# Patient Record
Sex: Male | Born: 2000 | Race: White | Hispanic: No | Marital: Single | Smoking: Never smoker
Health system: Southern US, Community
[De-identification: ages and names within clinical notes are randomized; demographics above are authoritative.]

---
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
## 2018-05-21 IMAGING — DX DG SCOLIOSIS EVAL COMPLETE SPINE 1V
1 series · 1 of 1 positions shown · non-contrast
Comparison: None in PACs

CLINICAL DATA: Suspected scoliosis on physical examination. The
patient reports some back pain.

EXAM:
DG SCOLIOSIS EVAL COMPLETE SPINE 1V

[dg scoliosis ap]
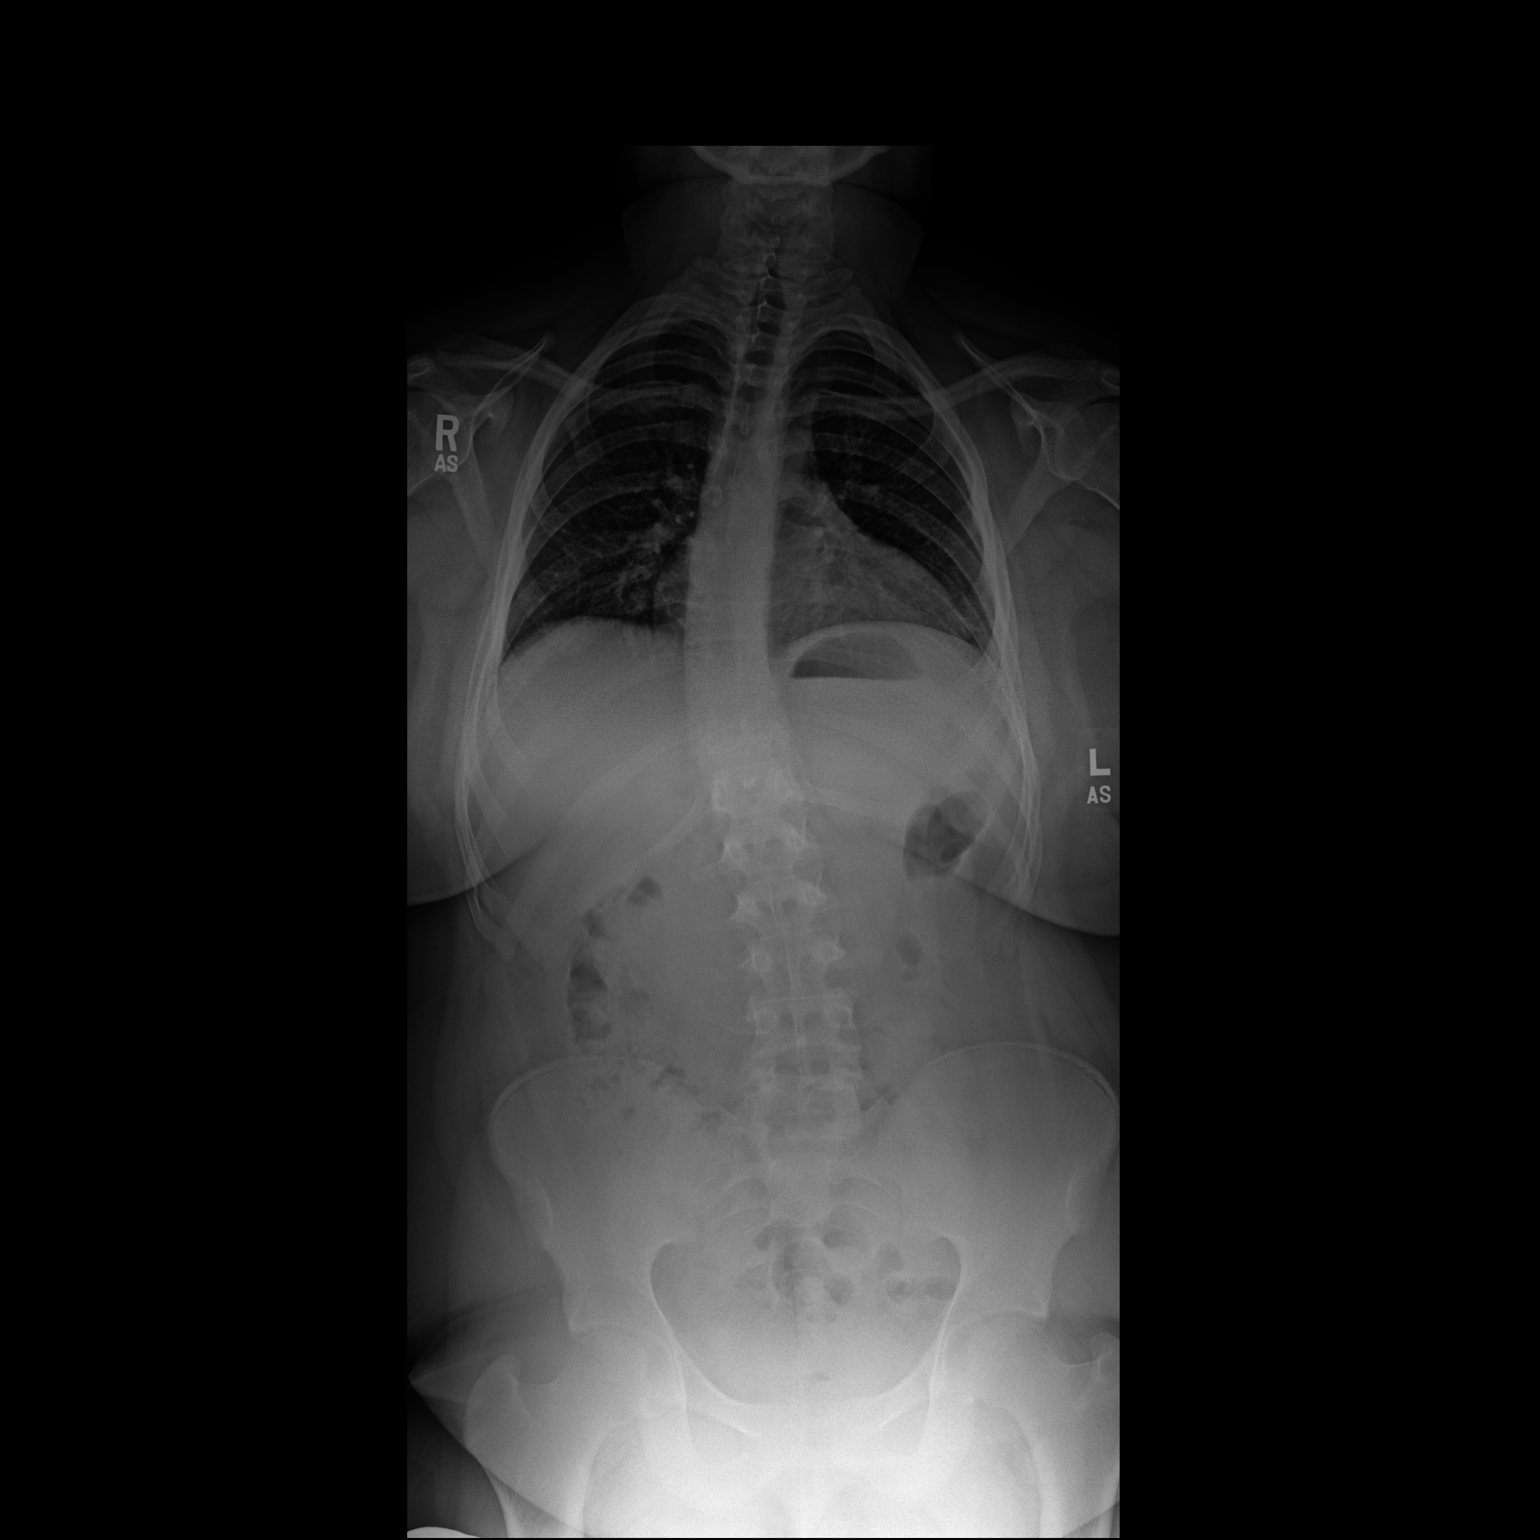

[1 of 1 positions shown; findings below may reference images not displayed]

FINDINGS: There is S shaped thoracolumbar curvature. The upper curvature
convex toward the right is centered at T10 with angulation of 22
degrees. The lower curvature is centered at L5 convex toward the
left with angulation of 13 degrees. No vertebral body anomalies are
observed. The observed portions of the chest and abdomen are
unremarkable.
IMPRESSION: S shaped thoracolumbar curvature with angulation as described.

## 2020-03-23 ENCOUNTER — Ambulatory Visit: Payer: 59 | Attending: Internal Medicine

## 2020-03-23 DIAGNOSIS — Z23 Encounter for immunization: Secondary | ICD-10-CM

## 2020-03-23 NOTE — Progress Notes (Signed)
   Covid-19 Vaccination Clinic  Name:  Nathaniel Dickerson    MRN: [id number] DOB: 01/28/2001  03/23/2020  Nathaniel Dickerson was observed post Covid-19 immunization for 15 minutes without incident. He was provided with Vaccine Information Sheet and instruction to access the V-Safe system.   Nathaniel Dickerson was instructed to call 911 with any severe reactions post vaccine: Marland Kitchen Difficulty breathing  . Swelling of face and throat  . A fast heartbeat  . A bad rash all over body  . Dizziness and weakness   Immunizations Administered    Name Date Dose VIS Date Route   Pfizer COVID-19 Vaccine 03/23/2020 10:00 AM 0.3 mL 11/25/2019 Intramuscular   Manufacturer: [employer], [employer]   Lot: [id number]   NDC: [address]-4

## 2020-04-16 ENCOUNTER — Ambulatory Visit: Payer: 59 | Attending: Internal Medicine

## 2020-04-16 DIAGNOSIS — Z23 Encounter for immunization: Secondary | ICD-10-CM

## 2020-04-16 NOTE — Progress Notes (Signed)
   Covid-19 Vaccination Clinic  Name:  Nathaniel Dickerson    MRN: [id number] DOB: 01-Feb-2001  04/16/2020  Mr. [name] was observed post Covid-19 immunization for 15 minutes without incident. He was provided with Vaccine Information Sheet and instruction to access the V-Safe system.   Mr. [name] was instructed to call 911 with any severe reactions post vaccine: Marland Kitchen Difficulty breathing  . Swelling of face and throat  . A fast heartbeat  . A bad rash all over body  . Dizziness and weakness   Immunizations Administered    Name Date Dose VIS Date Route   Pfizer COVID-19 Vaccine 04/16/2020 11:09 AM 0.3 mL 02/08/2019 Intramuscular   Manufacturer: [employer], [employer]   Lot: [id number]   NDC: [address]-2

## 2022-08-01 ENCOUNTER — Other Ambulatory Visit: Payer: Self-pay | Admitting: Family Medicine

## 2022-08-01 ENCOUNTER — Ambulatory Visit
Admission: RE | Admit: 2022-08-01 | Discharge: 2022-08-01 | Disposition: A | Discharge status: Home / Self Care | Payer: 59 | Source: Ambulatory Visit | Attending: Family Medicine | Admitting: Family Medicine

## 2022-08-01 DIAGNOSIS — M419 Scoliosis, unspecified: Secondary | ICD-10-CM
